# Patient Record
Sex: Female | Born: 1975 | Race: White | Hispanic: No | Smoking: Never smoker
Health system: Southern US, Community
[De-identification: ages and names within clinical notes are randomized; demographics above are authoritative.]

## PROBLEM LIST (undated history)

## (undated) DIAGNOSIS — E119 Type 2 diabetes mellitus without complications: Secondary | ICD-10-CM

## (undated) HISTORY — PX: APPENDECTOMY: SHX54

---

## 1997-03-01 ENCOUNTER — Observation Stay (HOSPITAL_COMMUNITY): Admission: AD | Admit: 1997-03-01 | Discharge: 1997-03-02 | Payer: Self-pay | Admitting: Obstetrics

## 1997-04-19 ENCOUNTER — Inpatient Hospital Stay (HOSPITAL_COMMUNITY): Admission: AD | Admit: 1997-04-19 | Discharge: 1997-04-23 | Payer: Self-pay | Admitting: *Deleted

## 1997-06-12 ENCOUNTER — Inpatient Hospital Stay (HOSPITAL_COMMUNITY): Admission: AD | Admit: 1997-06-12 | Discharge: 1997-06-12 | Payer: Self-pay | Admitting: Obstetrics & Gynecology

## 1997-06-14 ENCOUNTER — Inpatient Hospital Stay (HOSPITAL_COMMUNITY): Admission: AD | Admit: 1997-06-14 | Discharge: 1997-06-14 | Payer: Self-pay | Admitting: Obstetrics

## 1997-06-17 ENCOUNTER — Inpatient Hospital Stay (HOSPITAL_COMMUNITY): Admission: AD | Admit: 1997-06-17 | Discharge: 1997-06-19 | Payer: Self-pay | Admitting: Obstetrics

## 1998-05-31 ENCOUNTER — Emergency Department (HOSPITAL_COMMUNITY): Admission: EM | Admit: 1998-05-31 | Discharge: 1998-05-31 | Payer: Self-pay | Admitting: Internal Medicine

## 1999-03-01 ENCOUNTER — Emergency Department (HOSPITAL_COMMUNITY): Admission: EM | Admit: 1999-03-01 | Discharge: 1999-03-01 | Payer: Self-pay | Admitting: Emergency Medicine

## 1999-03-01 ENCOUNTER — Encounter: Payer: Self-pay | Admitting: Emergency Medicine

## 1999-11-22 ENCOUNTER — Other Ambulatory Visit: Admission: RE | Admit: 1999-11-22 | Discharge: 1999-11-22 | Payer: Self-pay | Admitting: Obstetrics and Gynecology

## 2000-10-08 ENCOUNTER — Other Ambulatory Visit: Admission: RE | Admit: 2000-10-08 | Discharge: 2000-10-08 | Payer: Self-pay | Admitting: Obstetrics and Gynecology

## 2000-10-23 ENCOUNTER — Encounter: Payer: Self-pay | Admitting: Emergency Medicine

## 2000-10-23 ENCOUNTER — Emergency Department (HOSPITAL_COMMUNITY): Admission: EM | Admit: 2000-10-23 | Discharge: 2000-10-24 | Payer: Self-pay | Admitting: Emergency Medicine

## 2000-12-02 ENCOUNTER — Emergency Department (HOSPITAL_COMMUNITY): Admission: EM | Admit: 2000-12-02 | Discharge: 2000-12-02 | Payer: Self-pay | Admitting: Emergency Medicine

## 2000-12-02 ENCOUNTER — Encounter: Payer: Self-pay | Admitting: Emergency Medicine

## 2001-06-05 ENCOUNTER — Inpatient Hospital Stay (HOSPITAL_COMMUNITY): Admission: AD | Admit: 2001-06-05 | Discharge: 2001-06-05 | Payer: Self-pay | Admitting: *Deleted

## 2001-06-06 ENCOUNTER — Emergency Department (HOSPITAL_COMMUNITY): Admission: EM | Admit: 2001-06-06 | Discharge: 2001-06-06 | Payer: Self-pay | Admitting: Emergency Medicine

## 2001-12-30 ENCOUNTER — Inpatient Hospital Stay (HOSPITAL_COMMUNITY): Admission: AD | Admit: 2001-12-30 | Discharge: 2001-12-30 | Payer: Self-pay | Admitting: *Deleted

## 2002-02-24 ENCOUNTER — Inpatient Hospital Stay (HOSPITAL_COMMUNITY): Admission: AD | Admit: 2002-02-24 | Discharge: 2002-02-24 | Payer: Self-pay | Admitting: *Deleted

## 2002-03-09 ENCOUNTER — Inpatient Hospital Stay (HOSPITAL_COMMUNITY): Admission: AD | Admit: 2002-03-09 | Discharge: 2002-03-11 | Payer: Self-pay | Admitting: *Deleted

## 2002-04-20 ENCOUNTER — Other Ambulatory Visit: Admission: RE | Admit: 2002-04-20 | Discharge: 2002-04-20 | Payer: Self-pay | Admitting: *Deleted

## 2003-08-22 ENCOUNTER — Emergency Department (HOSPITAL_COMMUNITY): Admission: EM | Admit: 2003-08-22 | Discharge: 2003-08-22 | Payer: Self-pay | Admitting: Emergency Medicine

## 2004-01-05 ENCOUNTER — Emergency Department (HOSPITAL_COMMUNITY): Admission: EM | Admit: 2004-01-05 | Discharge: 2004-01-05 | Payer: Self-pay | Admitting: Emergency Medicine

## 2004-01-05 ENCOUNTER — Encounter: Payer: Self-pay | Admitting: Obstetrics and Gynecology

## 2004-01-09 ENCOUNTER — Inpatient Hospital Stay (HOSPITAL_COMMUNITY): Admission: AD | Admit: 2004-01-09 | Discharge: 2004-01-09 | Payer: Self-pay | Admitting: Obstetrics and Gynecology

## 2004-01-14 ENCOUNTER — Inpatient Hospital Stay (HOSPITAL_COMMUNITY): Admission: AD | Admit: 2004-01-14 | Discharge: 2004-01-14 | Payer: Self-pay | Admitting: Obstetrics & Gynecology

## 2004-01-24 ENCOUNTER — Inpatient Hospital Stay (HOSPITAL_COMMUNITY): Admission: AD | Admit: 2004-01-24 | Discharge: 2004-01-24 | Payer: Self-pay | Admitting: Family Medicine

## 2004-10-11 ENCOUNTER — Emergency Department (HOSPITAL_COMMUNITY): Admission: EM | Admit: 2004-10-11 | Discharge: 2004-10-11 | Payer: Self-pay | Admitting: Emergency Medicine

## 2005-02-26 ENCOUNTER — Emergency Department (HOSPITAL_COMMUNITY): Admission: EM | Admit: 2005-02-26 | Discharge: 2005-02-26 | Payer: Self-pay | Admitting: Emergency Medicine

## 2005-11-05 ENCOUNTER — Inpatient Hospital Stay (HOSPITAL_COMMUNITY): Admission: AD | Admit: 2005-11-05 | Discharge: 2005-11-05 | Payer: Self-pay | Admitting: Obstetrics and Gynecology

## 2006-10-24 ENCOUNTER — Emergency Department (HOSPITAL_COMMUNITY): Admission: EM | Admit: 2006-10-24 | Discharge: 2006-10-24 | Payer: Self-pay | Admitting: Emergency Medicine

## 2010-02-18 ENCOUNTER — Encounter: Payer: Self-pay | Admitting: Obstetrics and Gynecology

## 2019-02-03 ENCOUNTER — Other Ambulatory Visit: Payer: Self-pay

## 2019-02-03 ENCOUNTER — Emergency Department (INDEPENDENT_AMBULATORY_CARE_PROVIDER_SITE_OTHER)
Admission: EM | Admit: 2019-02-03 | Discharge: 2019-02-03 | Disposition: A | Discharge status: Home / Self Care | Payer: Self-pay | Source: Home / Self Care

## 2019-02-03 ENCOUNTER — Encounter: Payer: Self-pay | Admitting: Emergency Medicine

## 2019-02-03 DIAGNOSIS — Z20822 Contact with and (suspected) exposure to covid-19: Secondary | ICD-10-CM

## 2019-02-03 HISTORY — DX: Type 2 diabetes mellitus without complications: E11.9

## 2019-02-03 NOTE — Discharge Instructions (Addendum)
Quarantine until receiving the results of your COVID swab

## 2019-02-03 NOTE — ED Triage Notes (Signed)
Pt states someone at work is out being tested for covid and she has to have test to return. No symptoms.

## 2019-02-03 NOTE — ED Provider Notes (Signed)
[facility]    CSN: [id number] Arrival date & time: 02/03/19  1215      History   Chief Complaint Chief Complaint  Patient presents with  . covid exposure    HPI Heidi Swanson is a 44 y.o. female.   44 year old female, with history of diabetes, presenting today for Covid testing.  Patient states that on Sunday, she was around a coworker that woke up Monday with Covid symptoms.  Patient states that she is unable to return to work until she receives a Covid swab.  Patient asymptomatic.  The history is provided by the patient.  URI Presenting symptoms: no congestion, no cough, no ear pain, no fever and no sore throat   Severity:  Mild Timing:  Constant Progression:  Unchanged Chronicity:  New Relieved by:  Nothing Worsened by:  Nothing Ineffective treatments:  None tried Associated symptoms: no arthralgias and no headaches   Risk factors: sick contacts   Risk factors: not elderly, no chronic cardiac disease, no chronic kidney disease, no chronic respiratory disease, no diabetes mellitus, no immunosuppression, no recent illness and no recent travel     Past Medical History:  Diagnosis Date  . Diabetes mellitus without complication ([place])     There are no problems to display for this patient.   History reviewed. No pertinent surgical history.  OB History   No obstetric history on file.      Home Medications    Prior to Admission medications   Medication Sig Start Date End Date Taking? Authorizing Provider  aspirin 81 MG EC tablet Take by mouth. 07/06/18 07/06/19 Yes [provider]  metFORMIN (GLUCOPHAGE) 500 MG tablet Take by mouth.    [provider]    Family History History reviewed. No pertinent family history.  Social History Social History   Tobacco Use  . Smoking status: Never Smoker  . Smokeless tobacco: Never Used  Substance Use Topics  . Alcohol use: Not on file  . Drug use: Not on file     Allergies     Patient has no allergy information on record.   Review of Systems Review of Systems  Constitutional: Negative for chills and fever.  HENT: Negative for congestion, ear pain and sore throat.   Eyes: Negative for pain and visual disturbance.  Respiratory: Negative for cough and shortness of breath.   Cardiovascular: Negative for chest pain and palpitations.  Gastrointestinal: Negative for abdominal pain and vomiting.  Genitourinary: Negative for dysuria and hematuria.  Musculoskeletal: Negative for arthralgias and back pain.  Skin: Negative for color change and rash.  Neurological: Negative for seizures, syncope and headaches.  All other systems reviewed and are negative.    Physical Exam Triage Vital Signs ED Triage Vitals  Enc Vitals Group     BP 02/03/19 1247 113/80     Pulse Rate 02/03/19 1247 96     Resp --      Temp 02/03/19 1247 98.4 F (36.9 C)     Temp Source 02/03/19 1247 Oral     SpO2 02/03/19 1247 98 %     Weight 02/03/19 1248 152 lb (68.9 kg)     Height --      Head Circumference --      Peak Flow --      Pain Score 02/03/19 1248 0     Pain Loc --      Pain Edu? --      Excl. in GC? --    No  data found.  Updated Vital Signs BP 113/80 (BP Location: Right Arm)   Pulse 96   Temp 98.4 F (36.9 C) (Oral)   Wt 152 lb (68.9 kg)   LMP 01/26/2019   SpO2 98%   Visual Acuity Right Eye Distance:   Left Eye Distance:   Bilateral Distance:    Right Eye Near:   Left Eye Near:    Bilateral Near:     Physical Exam Vitals and nursing note reviewed.  Constitutional:      General: She is not in acute distress.    Appearance: She is well-developed.  HENT:     Head: Normocephalic and atraumatic.  Eyes:     Conjunctiva/sclera: Conjunctivae normal.  Cardiovascular:     Rate and Rhythm: Normal rate and regular rhythm.     Heart sounds: No murmur.  Pulmonary:     Effort: Pulmonary effort is normal. No respiratory distress.     Breath sounds: Normal breath  sounds.  Abdominal:     Palpations: Abdomen is soft.     Tenderness: There is no abdominal tenderness.  Musculoskeletal:     Cervical back: Neck supple.  Skin:    General: Skin is warm and dry.  Neurological:     Mental Status: She is alert.      UC Treatments / Results  Labs (all labs ordered are listed, but only abnormal results are displayed) Labs Reviewed  NOVEL CORONAVIRUS, NAA    EKG   Radiology No results found.  Procedures Procedures (including critical care time)  Medications Ordered in UC Medications - No data to display  Initial Impression / Assessment and Plan / UC Course  I have reviewed the triage vital signs and the nursing notes.  Pertinent labs & imaging results that were available during my care of the patient were reviewed by me and considered in my medical decision making (see chart for details).     Patient with possible exposure to Covid contact.  Patient asymptomatic.  Here today for testing to return to work.  She will quarantine until receiving results. Final Clinical Impressions(s) / UC Diagnoses   Final diagnoses:  Exposure to COVID-19 virus     Discharge Instructions     Quarantine until receiving the results of your COVID swab    ED Prescriptions    None     PDMP not reviewed this encounter.   Alecia Lemming, [place] 02/03/19 1258

## 2019-02-05 LAB — NOVEL CORONAVIRUS, NAA: SARS-CoV-2, NAA: DETECTED — AB

## 2019-02-06 ENCOUNTER — Telehealth: Payer: Self-pay | Admitting: Emergency Medicine

## 2019-02-06 NOTE — Telephone Encounter (Signed)

## 2019-02-06 NOTE — Telephone Encounter (Signed)
Patient contacted by phone and made aware of  positive covid  results. Pt verbalized understanding and had all questions answered.    

## 2019-09-04 ENCOUNTER — Emergency Department (HOSPITAL_BASED_OUTPATIENT_CLINIC_OR_DEPARTMENT_OTHER)
Admission: EM | Admit: 2019-09-04 | Discharge: 2019-09-04 | Disposition: A | Discharge status: Home / Self Care | Payer: BC Managed Care – PPO | Attending: Emergency Medicine | Admitting: Emergency Medicine

## 2019-09-04 ENCOUNTER — Other Ambulatory Visit: Payer: Self-pay

## 2019-09-04 ENCOUNTER — Emergency Department (HOSPITAL_BASED_OUTPATIENT_CLINIC_OR_DEPARTMENT_OTHER): Payer: BC Managed Care – PPO

## 2019-09-04 ENCOUNTER — Encounter (HOSPITAL_BASED_OUTPATIENT_CLINIC_OR_DEPARTMENT_OTHER): Payer: Self-pay

## 2019-09-04 DIAGNOSIS — M25562 Pain in left knee: Secondary | ICD-10-CM | POA: Diagnosis present

## 2019-09-04 DIAGNOSIS — R0789 Other chest pain: Secondary | ICD-10-CM | POA: Diagnosis not present

## 2019-09-04 DIAGNOSIS — Z5321 Procedure and treatment not carried out due to patient leaving prior to being seen by health care provider: Secondary | ICD-10-CM | POA: Insufficient documentation

## 2019-09-04 LAB — CBC
HCT: 32.6 % — ABNORMAL LOW (ref 36.0–46.0)
Hemoglobin: 9.5 g/dL — ABNORMAL LOW (ref 12.0–15.0)
MCH: 21.1 pg — ABNORMAL LOW (ref 26.0–34.0)
MCHC: 29.1 g/dL — ABNORMAL LOW (ref 30.0–36.0)
MCV: 72.3 fL — ABNORMAL LOW (ref 80.0–100.0)
Platelets: 446 10*3/uL — ABNORMAL HIGH (ref 150–400)
RBC: 4.51 MIL/uL (ref 3.87–5.11)
RDW: 18.9 % — ABNORMAL HIGH (ref 11.5–15.5)
WBC: 8.8 10*3/uL (ref 4.0–10.5)
nRBC: 0 % (ref 0.0–0.2)

## 2019-09-04 LAB — TROPONIN I (HIGH SENSITIVITY): Troponin I (High Sensitivity): 4 ng/L (ref <18)

## 2019-09-04 LAB — BASIC METABOLIC PANEL
Anion gap: 12 (ref 5–15)
BUN: 10 mg/dL (ref 6–20)
CO2: 21 mmol/L — ABNORMAL LOW (ref 22–32)
Calcium: 9 mg/dL (ref 8.9–10.3)
Chloride: 104 mmol/L (ref 98–111)
Creatinine, Ser: 0.73 mg/dL (ref 0.44–1.00)
GFR calc Af Amer: >60 mL/min (ref >60)
GFR calc non Af Amer: >60 mL/min (ref >60)
Glucose, Bld: 209 mg/dL — ABNORMAL HIGH (ref 70–99)
Potassium: 3.6 mmol/L (ref 3.5–5.1)
Sodium: 137 mmol/L (ref 135–145)

## 2019-09-04 LAB — D-DIMER, QUANTITATIVE: D-Dimer, Quant: 0.74 ug/mL-FEU — ABNORMAL HIGH (ref 0.00–0.50)

## 2019-09-04 LAB — PREGNANCY, URINE: Preg Test, Ur: NEGATIVE

## 2019-09-04 NOTE — ED Triage Notes (Signed)
Back of left knee pain without known injury, chest pain started yesterday, central constant chest pressure, BP has been higher than normal @ home lately.

## 2019-09-04 NOTE — ED Notes (Signed)
Pt reported to registration clerk she had an emergency at home and had to leave

## 2019-09-05 ENCOUNTER — Emergency Department (HOSPITAL_COMMUNITY): Payer: BC Managed Care – PPO

## 2019-09-05 ENCOUNTER — Other Ambulatory Visit: Payer: Self-pay

## 2019-09-05 ENCOUNTER — Emergency Department (HOSPITAL_BASED_OUTPATIENT_CLINIC_OR_DEPARTMENT_OTHER): Payer: BC Managed Care – PPO

## 2019-09-05 ENCOUNTER — Emergency Department (HOSPITAL_COMMUNITY)
Admission: EM | Admit: 2019-09-05 | Discharge: 2019-09-05 | Disposition: A | Discharge status: Home / Self Care | Payer: BC Managed Care – PPO | Attending: Emergency Medicine | Admitting: Emergency Medicine

## 2019-09-05 ENCOUNTER — Encounter (HOSPITAL_COMMUNITY): Payer: Self-pay

## 2019-09-05 DIAGNOSIS — R079 Chest pain, unspecified: Secondary | ICD-10-CM | POA: Diagnosis present

## 2019-09-05 DIAGNOSIS — M791 Myalgia, unspecified site: Secondary | ICD-10-CM | POA: Diagnosis not present

## 2019-09-05 DIAGNOSIS — M79609 Pain in unspecified limb: Secondary | ICD-10-CM | POA: Diagnosis not present

## 2019-09-05 DIAGNOSIS — M25562 Pain in left knee: Secondary | ICD-10-CM | POA: Insufficient documentation

## 2019-09-05 DIAGNOSIS — E119 Type 2 diabetes mellitus without complications: Secondary | ICD-10-CM | POA: Diagnosis not present

## 2019-09-05 DIAGNOSIS — R0789 Other chest pain: Secondary | ICD-10-CM

## 2019-09-05 LAB — BASIC METABOLIC PANEL
Anion gap: 11 (ref 5–15)
BUN: 9 mg/dL (ref 6–20)
CO2: 22 mmol/L (ref 22–32)
Calcium: 9.7 mg/dL (ref 8.9–10.3)
Chloride: 105 mmol/L (ref 98–111)
Creatinine, Ser: 0.59 mg/dL (ref 0.44–1.00)
GFR calc Af Amer: >60 mL/min (ref >60)
GFR calc non Af Amer: >60 mL/min (ref >60)
Glucose, Bld: 167 mg/dL — ABNORMAL HIGH (ref 70–99)
Potassium: 3.6 mmol/L (ref 3.5–5.1)
Sodium: 138 mmol/L (ref 135–145)

## 2019-09-05 LAB — CBG MONITORING, ED: Glucose-Capillary: 88 mg/dL (ref 70–99)

## 2019-09-05 LAB — CBC
HCT: 33.7 % — ABNORMAL LOW (ref 36.0–46.0)
Hemoglobin: 9.7 g/dL — ABNORMAL LOW (ref 12.0–15.0)
MCH: 21.1 pg — ABNORMAL LOW (ref 26.0–34.0)
MCHC: 28.8 g/dL — ABNORMAL LOW (ref 30.0–36.0)
MCV: 73.3 fL — ABNORMAL LOW (ref 80.0–100.0)
Platelets: 450 10*3/uL — ABNORMAL HIGH (ref 150–400)
RBC: 4.6 MIL/uL (ref 3.87–5.11)
RDW: 18.8 % — ABNORMAL HIGH (ref 11.5–15.5)
WBC: 8.5 10*3/uL (ref 4.0–10.5)
nRBC: 0 % (ref 0.0–0.2)

## 2019-09-05 LAB — D-DIMER, QUANTITATIVE: D-Dimer, Quant: 0.71 ug/mL-FEU — ABNORMAL HIGH (ref 0.00–0.50)

## 2019-09-05 LAB — TROPONIN I (HIGH SENSITIVITY)
Troponin I (High Sensitivity): 4 ng/L (ref <18)
Troponin I (High Sensitivity): 4 ng/L (ref <18)

## 2019-09-05 LAB — I-STAT BETA HCG BLOOD, ED (MC, WL, AP ONLY): I-stat hCG, quantitative: <5 m[IU]/mL (ref <5)

## 2019-09-05 MED ORDER — IOHEXOL 350 MG/ML SOLN
75.0000 mL | Freq: Once | INTRAVENOUS | Status: AC | PRN
  Administered 2019-09-05: 75 mL via INTRAVENOUS

## 2019-09-05 MED ORDER — SODIUM CHLORIDE 0.9% FLUSH: 3.0000 mL | Freq: Once | INTRAVENOUS | Status: DC

## 2019-09-05 NOTE — Progress Notes (Signed)
VASCULAR LAB    Bilateral lower extremity venous duplex completed.    Preliminary report:  See CV proc for preliminary results.  Roxanne Gates, RN, with report  Sherren Kerns, RVT 09/05/2019, 2:37 PM

## 2019-09-05 NOTE — ED Triage Notes (Addendum)
Pt reports Left posterior knee pain x1 day, then she developed constant chest pressure associated with SOB. Pt seen at [facility] yesterday, she was unable to stay d/t family concerns, pts D-dimer was elevated there.  Pt recently traveled to [place] Wednesday returning on Friday

## 2019-09-05 NOTE — ED Provider Notes (Signed)
[facility] EMERGENCY DEPARTMENT Provider Note   CSN: [id number] Arrival date & time: 09/05/19  1210     History Chief Complaint  Patient presents with  . Knee Pain  . Chest Pain    Heidi Swanson is a 44 y.o. female.  HPI  Patient presents to the emergency department for complaint of chest pain and knee pain.  Patient reportedly was traveling on Wednesday to a beach in the [place] when she arrived she had sudden onset left posterior knee pain.  This was not associated with any injury.  Patient noticed no warmth to touch or unilateral leg swelling at that time.  The pain subsequently went away however she did not developed chest pain.  Described as a pressure-like sensation and band around the chest that was squeezing her.  This is been intermittent since then.  She presented to an OSH ED yesterday for evaluation, there she had to leave for an emergency but was found to have elevated D-dimer.  She was asymptomatic when she left the department however pain returned overnight and that she came to the ED today for evaluation.  She denies any sick contacts.  No nausea, vomiting, or diarrhea.  No previous cardiac history although patient does admit to having intermittent chest pain in the past.  History of diabetes.  No previous history of DVT.  No fevers or chills.  No treatments attempted prior to arrival.      Past Medical History:  Diagnosis Date  . Diabetes mellitus without complication ([facility])     There are no problems to display for this patient.   Past Surgical History:  Procedure Laterality Date  . APPENDECTOMY       OB History   No obstetric history on file.     History reviewed. No pertinent family history.  Social History   Tobacco Use  . Smoking status: Never Smoker  . Smokeless tobacco: Never Used  Vaping Use  . Vaping Use: Never used  Substance Use Topics  . Alcohol use: Yes    Comment: rarely   . Drug use: Not Currently    Home  Medications Prior to Admission medications   Medication Sig Start Date End Date Taking? Authorizing Provider  metFORMIN (GLUCOPHAGE) 500 MG tablet Take by mouth.    [provider]    Allergies    Patient has no known allergies.  Review of Systems   Review of Systems  Constitutional: Negative for chills and fever.  HENT: Negative for ear pain and sore throat.   Eyes: Negative for pain and visual disturbance.  Respiratory: Negative for cough and shortness of breath.   Cardiovascular: Positive for chest pain. Negative for palpitations.  Gastrointestinal: Negative for abdominal pain and vomiting.  Genitourinary: Negative for dysuria and hematuria.  Musculoskeletal: Positive for myalgias. Negative for arthralgias and back pain.  Skin: Negative for color change and rash.  Neurological: Negative for seizures and syncope.  All other systems reviewed and are negative.   Physical Exam Updated Vital Signs BP (!) 132/93 (BP Location: Left Arm)   Pulse 86   Temp 98.9 F (37.2 C) (Oral)   Resp 20   Ht 5' (1.524 m)   Wt 70.3 kg   LMP 08/14/2019   SpO2 99%   BMI 30.27 kg/m   Physical Exam Vitals and nursing note reviewed.  Constitutional:      General: She is not in acute distress.    Appearance: She is well-developed and normal weight.  She is not ill-appearing or toxic-appearing.  HENT:     Head: Normocephalic and atraumatic.  Eyes:     Extraocular Movements: Extraocular movements intact.     Conjunctiva/sclera: Conjunctivae normal.     Pupils: Pupils are equal, round, and reactive to light.  Neck:     Vascular: No JVD.  Cardiovascular:     Rate and Rhythm: Normal rate and regular rhythm.     Pulses:          Radial pulses are 2+ on the right side and 2+ on the left side.       Dorsalis pedis pulses are 2+ on the right side and 2+ on the left side.     Heart sounds: No murmur heard.   Pulmonary:     Effort: Pulmonary effort is normal. No respiratory distress.      Breath sounds: Normal breath sounds. No wheezing or rhonchi.  Abdominal:     Palpations: Abdomen is soft.     Tenderness: There is no abdominal tenderness.  Musculoskeletal:     Cervical back: Neck supple.     Left lower leg: No tenderness. No edema.  Skin:    General: Skin is warm and dry.     Capillary Refill: Capillary refill takes less than 2 seconds.  Neurological:     General: No focal deficit present.     Mental Status: She is alert and oriented to person, place, and time.     Motor: No weakness.  Psychiatric:        Mood and Affect: Mood normal.        Behavior: Behavior normal.     ED Results / Procedures / Treatments   Labs (all labs ordered are listed, but only abnormal results are displayed) Labs Reviewed  BASIC METABOLIC PANEL - Abnormal; Notable for the following components:      Result Value   Glucose, Bld 167 (*)    All other components within normal limits  CBC - Abnormal; Notable for the following components:   Hemoglobin 9.7 (*)    HCT 33.7 (*)    MCV 73.3 (*)    MCH 21.1 (*)    MCHC 28.8 (*)    RDW 18.8 (*)    Platelets 450 (*)    All other components within normal limits  D-DIMER, QUANTITATIVE (NOT AT [facility]) - Abnormal; Notable for the following components:   D-Dimer, Quant 0.71 (*)    All other components within normal limits  I-STAT BETA HCG BLOOD, ED ([facility], [facility], [facility] ONLY)  TROPONIN I (HIGH SENSITIVITY)  TROPONIN I (HIGH SENSITIVITY)    EKG None  Radiology DG Chest 2 View  Result Date: 09/05/2019 CLINICAL DATA:  Chest pain and shortness of breath for 3 days. EXAM: CHEST - 2 VIEW COMPARISON:  09/04/2019 FINDINGS: The heart size and mediastinal contours are within normal limits. Both lungs are clear. The visualized skeletal structures are unremarkable. IMPRESSION: No active cardiopulmonary disease. Electronically Signed   By: Norva Pavlov M.D.   On: 09/05/2019 13:10   DG Chest 2 View  Result Date: 09/04/2019 CLINICAL DATA:  Acute onset chest  pain, shortness of breath, and dizziness. EXAM: CHEST - 2 VIEW COMPARISON:  None. FINDINGS: The heart size and mediastinal contours are within normal limits. Both lungs are clear. The visualized skeletal structures are unremarkable. IMPRESSION: No active cardiopulmonary disease. Electronically Signed   By: Burman Nieves M.D.   On: 09/04/2019 22:03   VAS [place] LOWER EXTREMITY VENOUS (DVT) (  ONLY [facility] & [facility])  Result Date: 09/05/2019  Lower Venous DVTStudy Indications: Left posterior knee pain.  Comparison Study: No prior study Performing Technologist: Sherren Kerns RVS  Examination Guidelines: A complete evaluation includes B-mode imaging, spectral Doppler, color Doppler, and power Doppler as needed of all accessible portions of each vessel. Bilateral testing is considered an integral part of a complete examination. Limited examinations for reoccurring indications may be performed as noted. The reflux portion of the exam is performed with the patient in reverse Trendelenburg.  +---------+---------------+---------+-----------+----------+--------------+ RIGHT    CompressibilityPhasicitySpontaneityPropertiesThrombus Aging +---------+---------------+---------+-----------+----------+--------------+ CFV      Full           Yes      Yes                                 +---------+---------------+---------+-----------+----------+--------------+ SFJ      Full                                                        +---------+---------------+---------+-----------+----------+--------------+ FV Prox  Full                                                        +---------+---------------+---------+-----------+----------+--------------+ FV Mid   Full                                                        +---------+---------------+---------+-----------+----------+--------------+ FV DistalFull                                                         +---------+---------------+---------+-----------+----------+--------------+ PFV      Full                                                        +---------+---------------+---------+-----------+----------+--------------+ POP      Full           Yes      Yes                                 +---------+---------------+---------+-----------+----------+--------------+ PTV      Full                                                        +---------+---------------+---------+-----------+----------+--------------+ PERO     Full                                                        +---------+---------------+---------+-----------+----------+--------------+   +---------+---------------+---------+-----------+----------+--------------+  LEFT     CompressibilityPhasicitySpontaneityPropertiesThrombus Aging +---------+---------------+---------+-----------+----------+--------------+ CFV      Full           Yes      Yes                                 +---------+---------------+---------+-----------+----------+--------------+ SFJ      Full                                                        +---------+---------------+---------+-----------+----------+--------------+ FV Prox  Full                                                        +---------+---------------+---------+-----------+----------+--------------+ FV Mid   Full                                                        +---------+---------------+---------+-----------+----------+--------------+ FV DistalFull                                                        +---------+---------------+---------+-----------+----------+--------------+ PFV      Full                                                        +---------+---------------+---------+-----------+----------+--------------+ POP      Full           Yes      Yes                                  +---------+---------------+---------+-----------+----------+--------------+ PTV      Full                                                        +---------+---------------+---------+-----------+----------+--------------+ PERO     Full                                                        +---------+---------------+---------+-----------+----------+--------------+     Summary: BILATERAL: - No evidence of deep vein thrombosis seen in the lower extremities, bilaterally. -No evidence of popliteal cyst, bilaterally.   *See table(s) above for measurements and observations.    Preliminary     Procedures Procedures (including critical care  time)  Medications Ordered in ED Medications  sodium chloride flush (NS) 0.9 % injection 3 mL (has no administration in time range)    ED Course   Orinda KennerDeborah A Bruun is a 44 y.o. female with PMHx listed that presents to the Emergency Department complaint of Knee Pain and Chest Pain  ED Course: Initial exam completed.   Well-appearing and hemodynamically stable.  Nontoxic and afebrile.  Physical exam significant for age-appropriate 44 year old female with extremities well perfused, no unilateral leg swelling, strong peripheral pulses, clear breath sounds, distended, nontender.  Initial differential includes acute coronary syndrome, DVT, pulmonary embolism, acid reflux, pneumonia, pneumothorax, electrolyte abnormalities, and musculoskeletal pain.   Triage labs and work-up reviewed.  EKG sinus rhythm with nonspecific ST changes. Pregnancy negative.  BMP with no acute electrolyte numbness requiring urgent intervention.  CBC without evidence of leukocytosis or leukopenia and stable hemoglobin although slightly anemic 9.7.  D-dimer elevated 0.71  (yesterday 0.74).  Initial troponin negative.  Ultrasound of the left lower extremity without evidence of DVT or popliteal cyst.  CXR no acute cardiopulmonary normality.  Delta troponin negative.  CT PE study without  evidence of pulmonary embolism.  Overall, low suspicion for acute coronary syndrome or other serious causes of the patient's chest pain at this time.   Diagnostics Vital Signs: reviewed Labs: reviewed and significant findings discussed above Imaging: personally reviewed images interpreted by radiology EKG: reviewed Records: nursing notes along with previous records reviewed and pertinent data discussed   Consults:  None   Reevaluation/Disposition:  Upon reevaluation, patients symptoms stable/improved. No active nausea/vomiting and ambulatory without assistance prior to discharge from the emergency department.    All questions answered.  Strict return precautions were discussed. Additionally we discussed establishing and/or following-up with primary care physician.  Patient and/or family was understanding and in agreement with today's assessment and plan.   Campbell RichesAlex Breona Cherubin, MD Emergency Medicine, PGY-3   Note: Dragon medical dictation software was used in the creation of this note.    Final Clinical Impression(s) / ED Diagnoses Final diagnoses:  None    Rx / DC Orders ED Discharge Orders    None       Nino ParsleyGross, Khalil Belote, MD 09/05/19 Barnie [id number]    Blane OharaZavitz, Joshua, MD 09/06/19 0005

## 2021-11-05 IMAGING — CR DG CHEST 2V
2 series · 2 of 2 positions shown · non-contrast
Comparison: None.

CLINICAL DATA: Acute onset chest pain, shortness of breath, and
dizziness.

EXAM:
CHEST - 2 VIEW

[w chest pa]
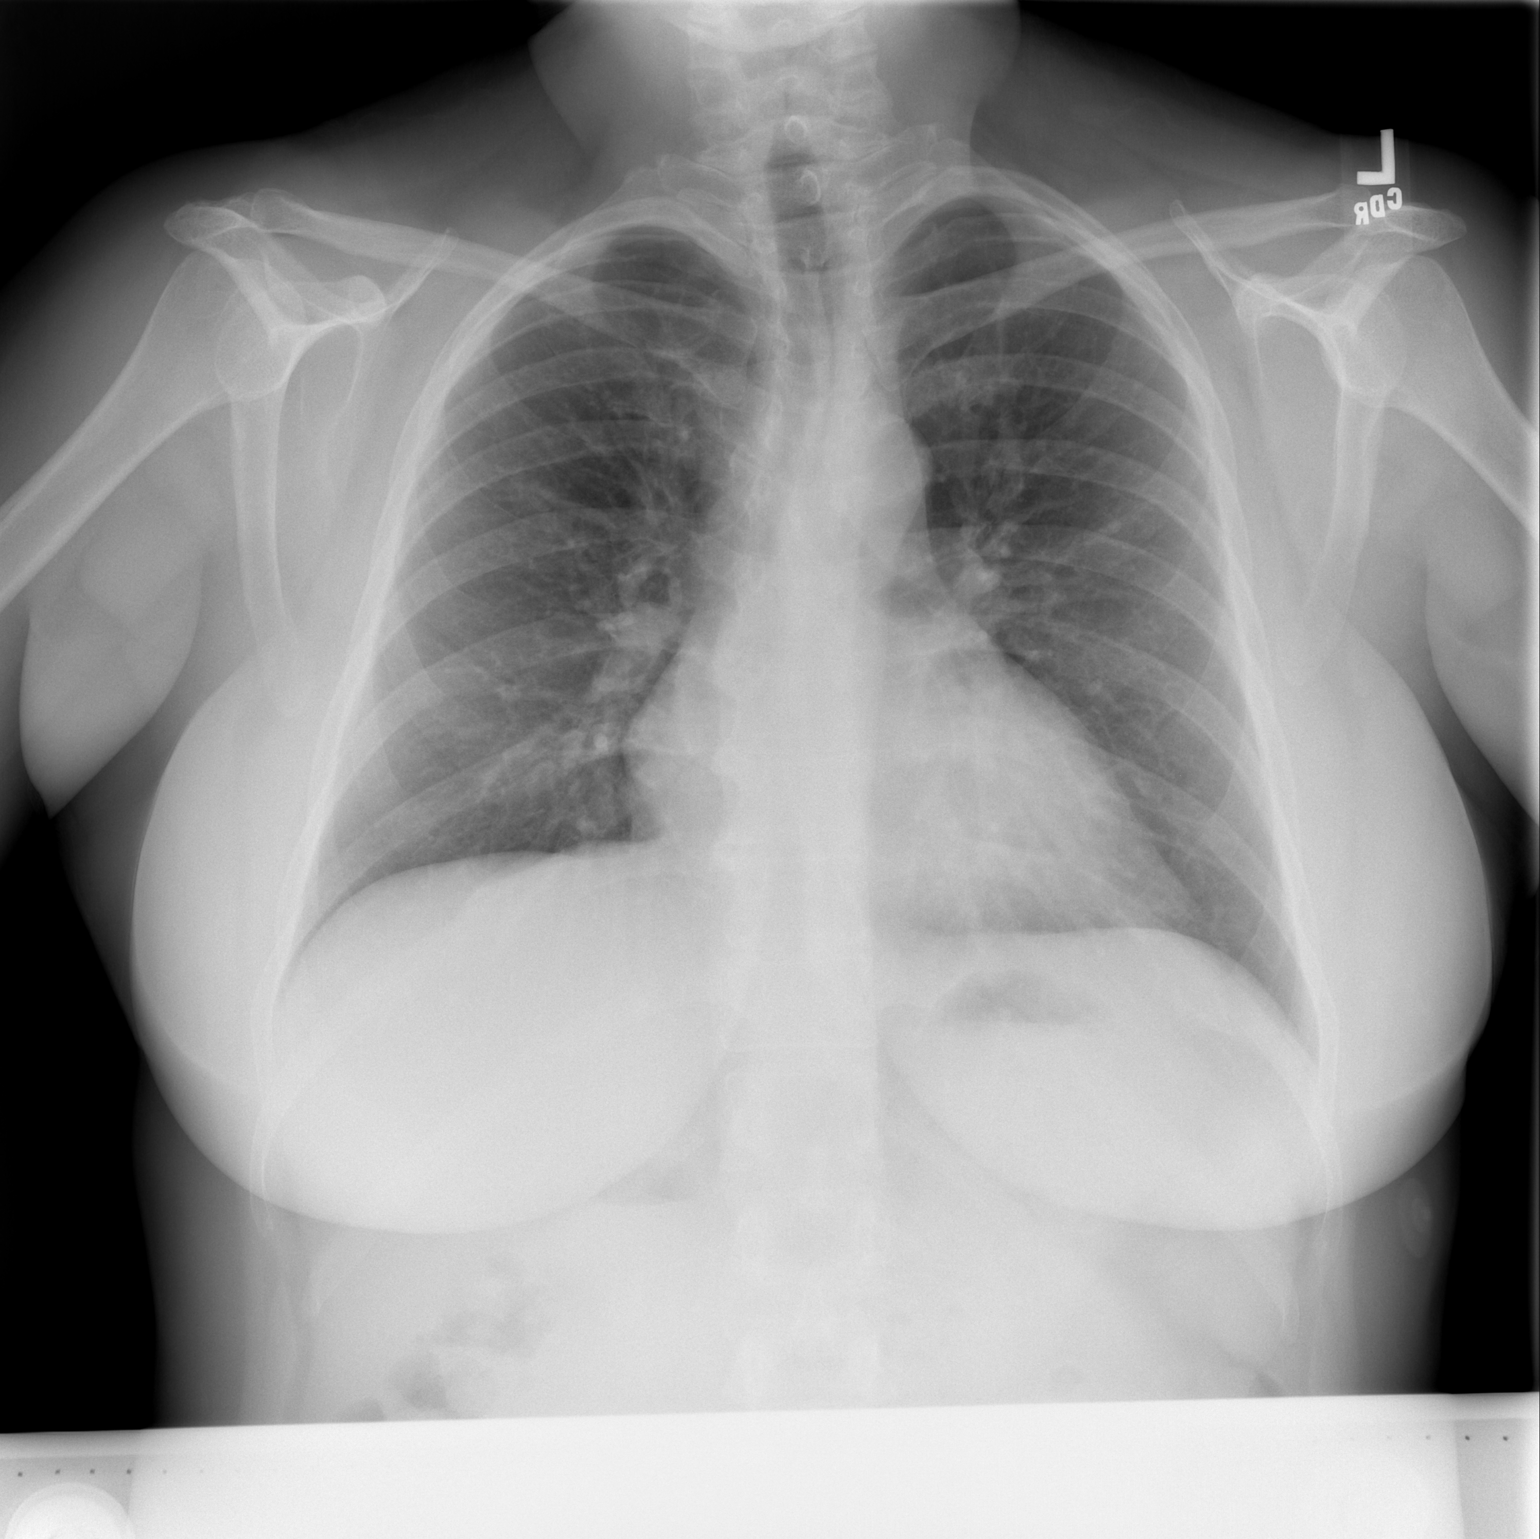

[w chest lat]
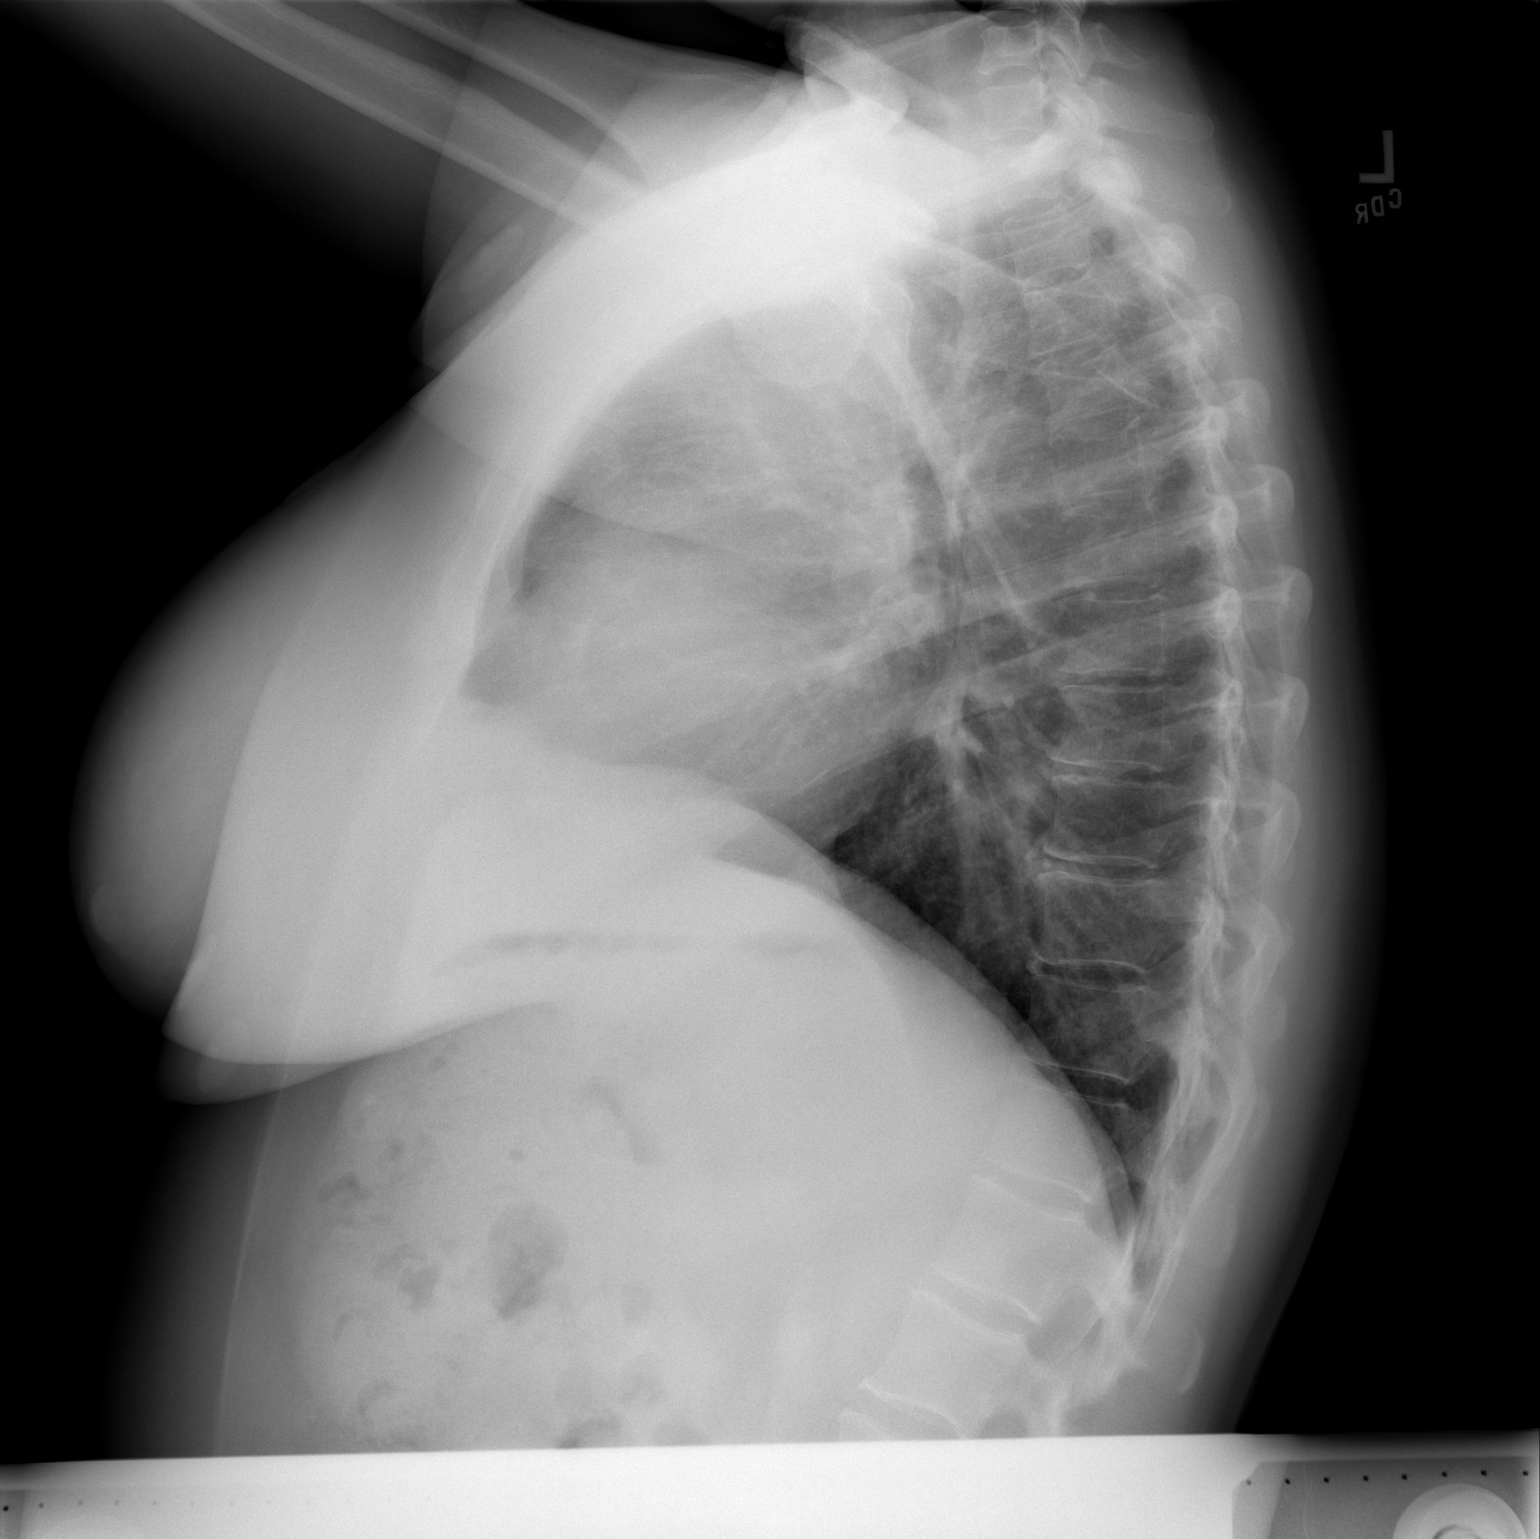

[2 of 2 positions shown; findings below may reference images not displayed]

FINDINGS: The heart size and mediastinal contours are within normal limits.
Both lungs are clear. The visualized skeletal structures are
unremarkable.
IMPRESSION: No active cardiopulmonary disease.

## 2021-11-06 IMAGING — DX DG CHEST 2V
2 series · 2 of 2 positions shown · non-contrast
Comparison: 09/04/2019

CLINICAL DATA: Chest pain and shortness of breath for 3 days.

EXAM:
CHEST - 2 VIEW

[chest pa]
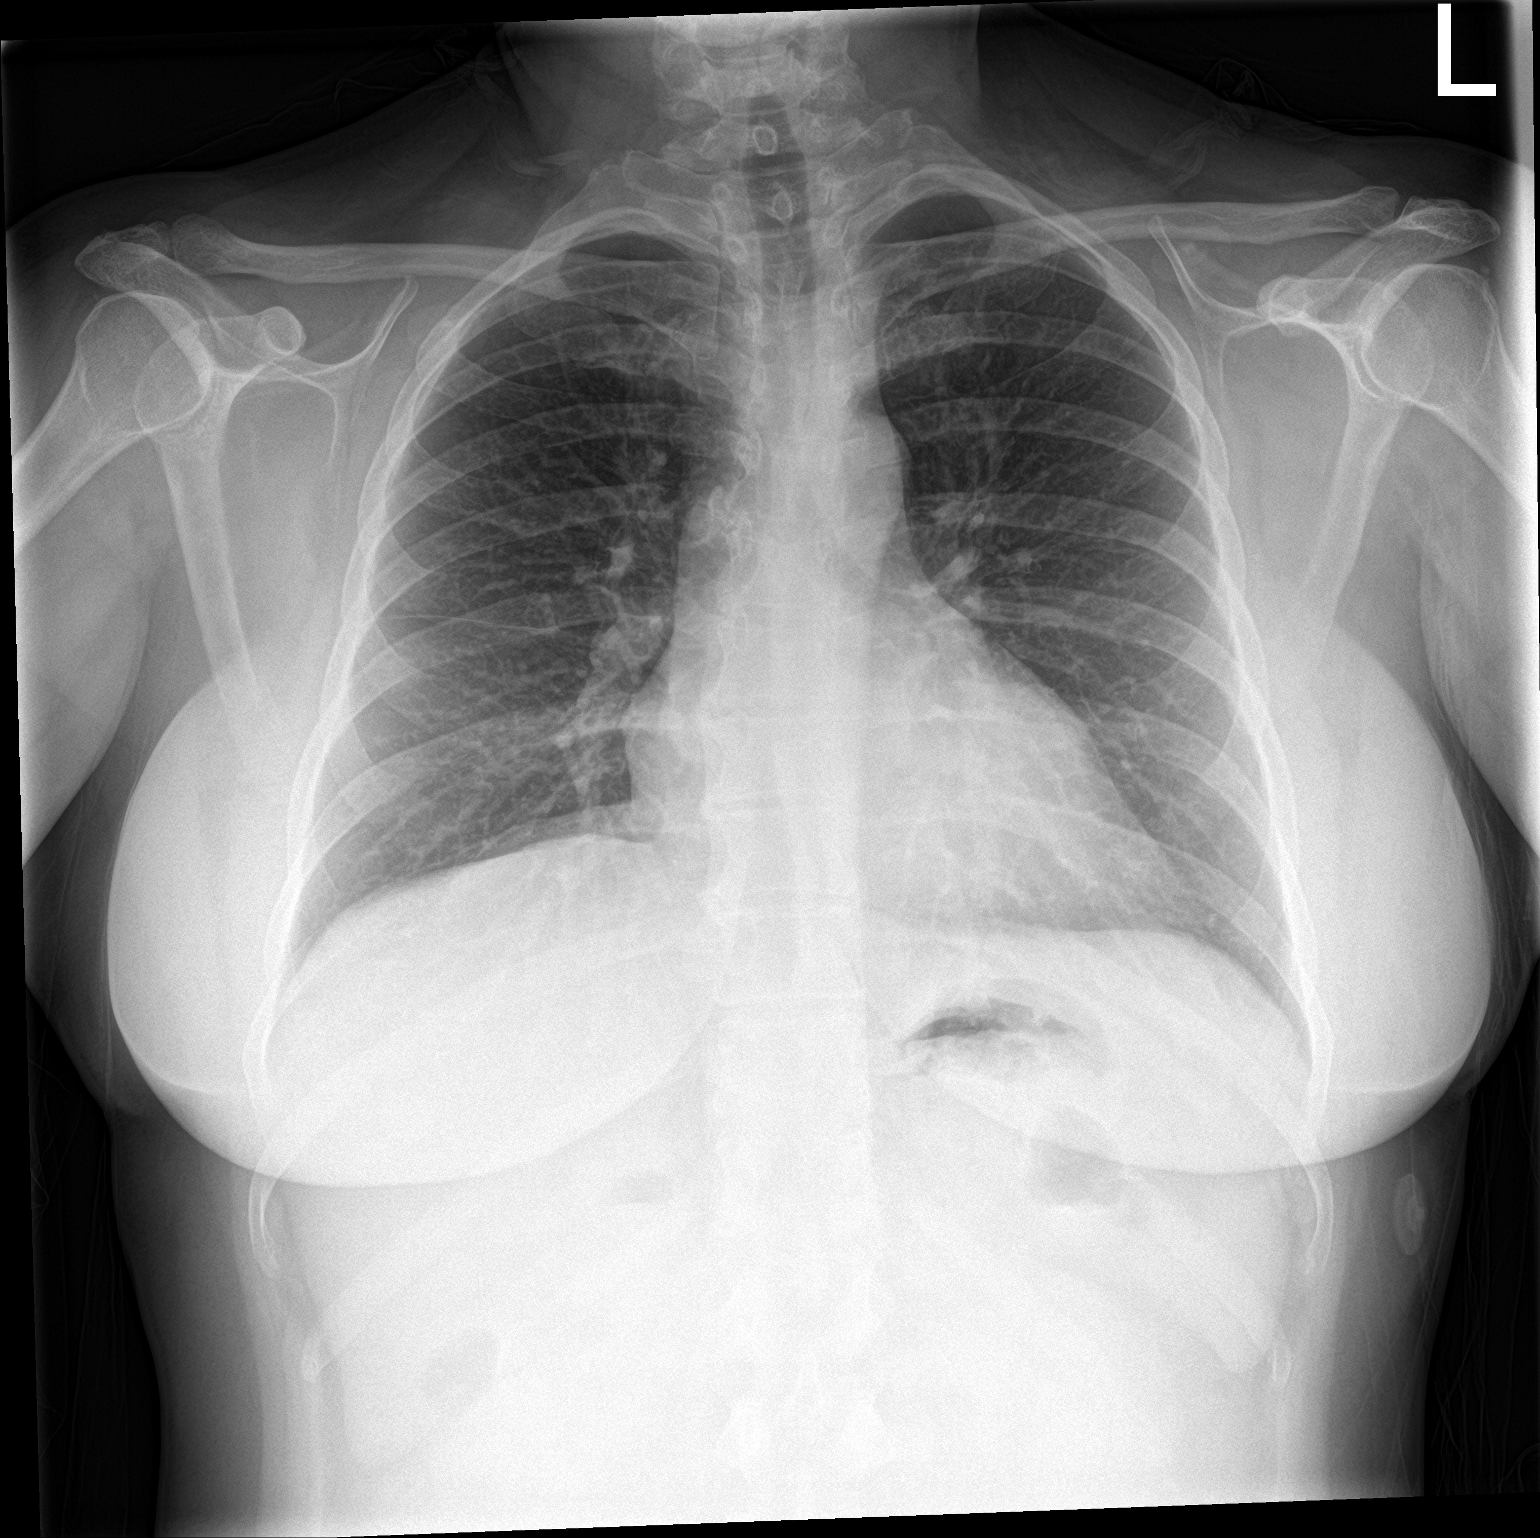

[chest lat]
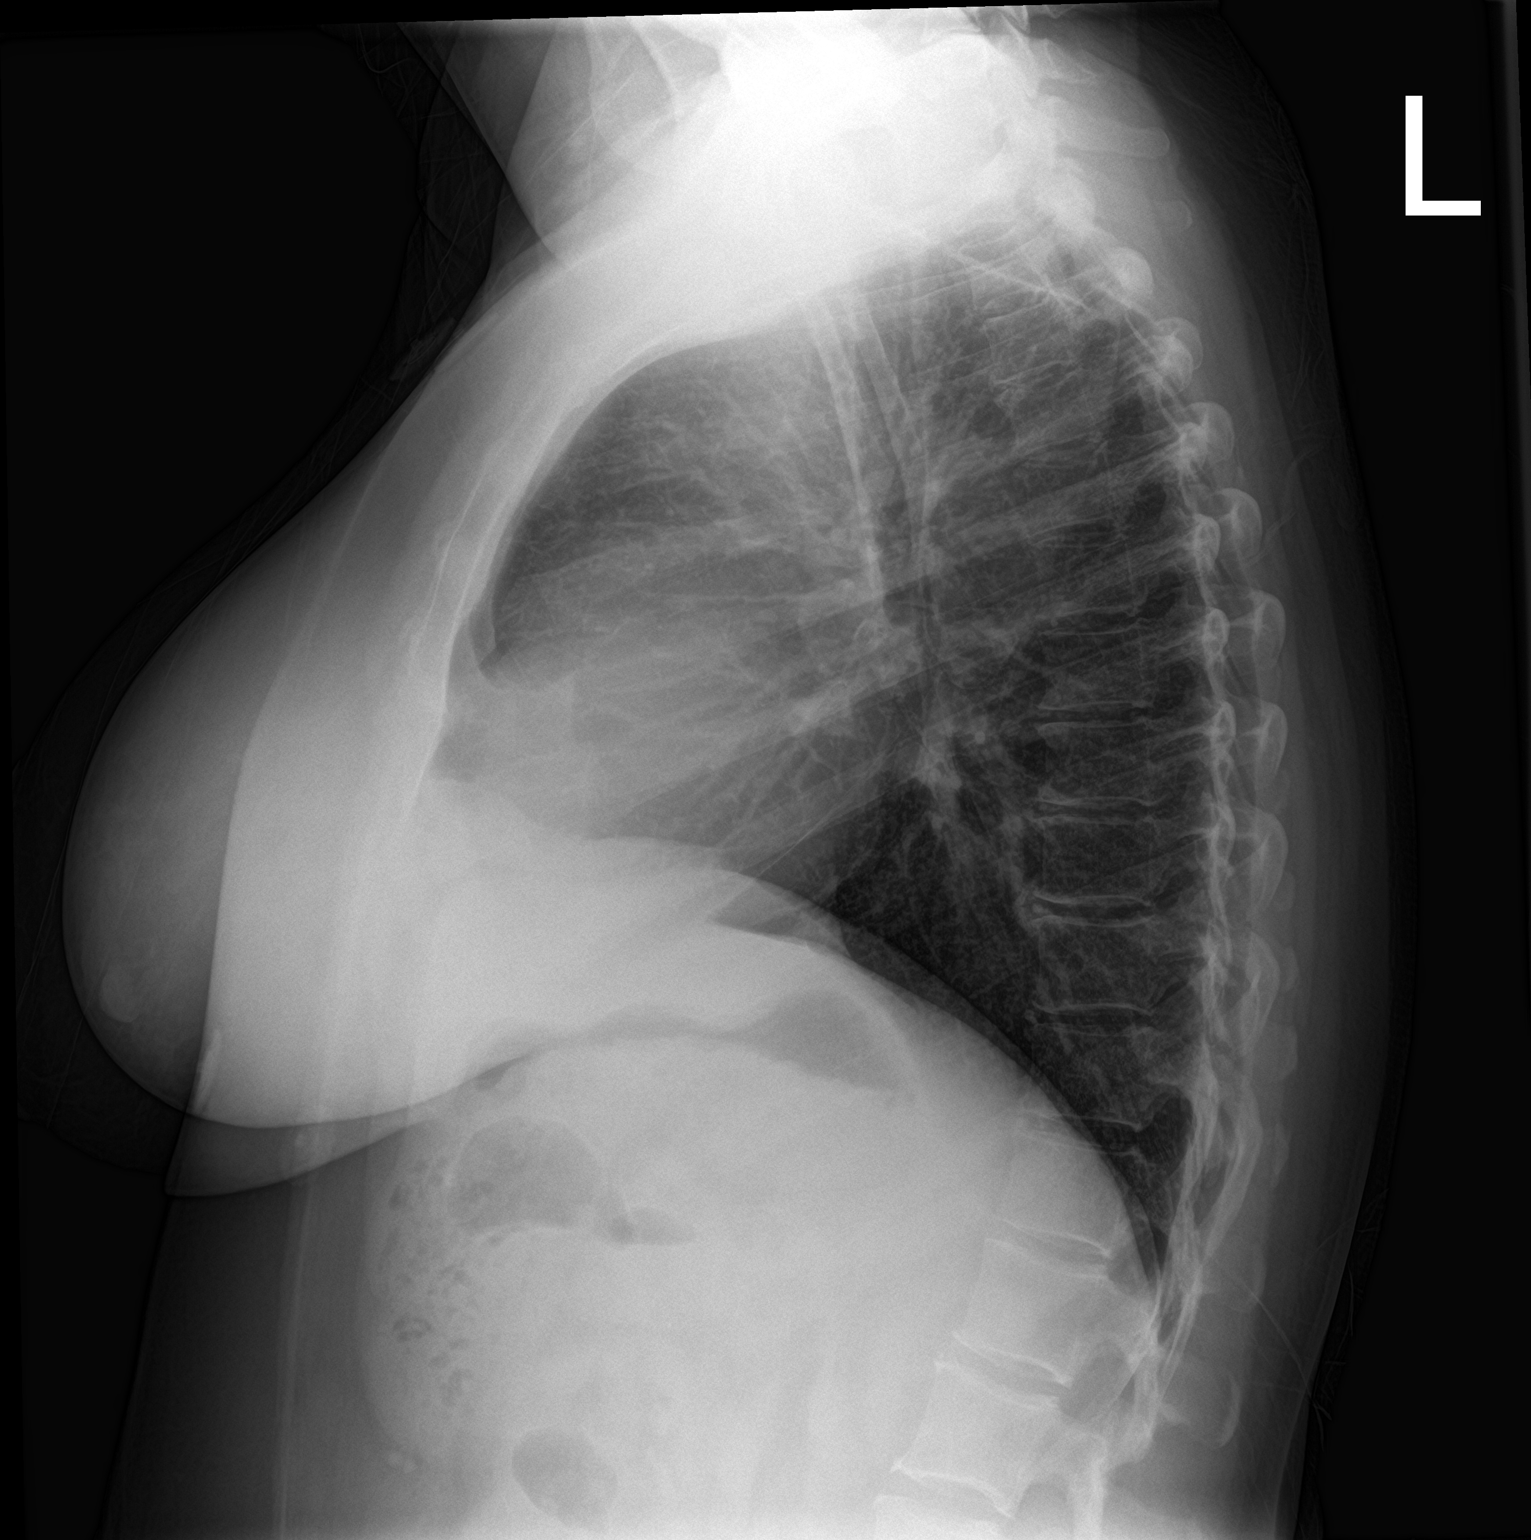

[2 of 2 positions shown; findings below may reference images not displayed]

FINDINGS: The heart size and mediastinal contours are within normal limits.
Both lungs are clear. The visualized skeletal structures are
unremarkable.
IMPRESSION: No active cardiopulmonary disease.

## 2021-11-06 IMAGING — CT CT ANGIO CHEST
2 of 7 series · 19 of 46 positions shown · IV contrast (omnipaque)
Comparison: None.

CLINICAL DATA: PE suspected, positive D-dimer

EXAM:
CT ANGIOGRAPHY CHEST WITH CONTRAST
TECHNIQUE: Multidetector CT imaging of the chest was performed using the
standard protocol during bolus administration of intravenous
contrast. Multiplanar CT image reconstructions and MIPs were
obtained to evaluate the vascular anatomy.
CONTRAST:  75mL OMNIPAQUE IOHEXOL 350 MG/ML SOLN

[Series 6: thins · axial · 0.72mm/px · z∈[+1228,+1452]mm · 16 of 254 slices shown]
[im 15/254  lung]
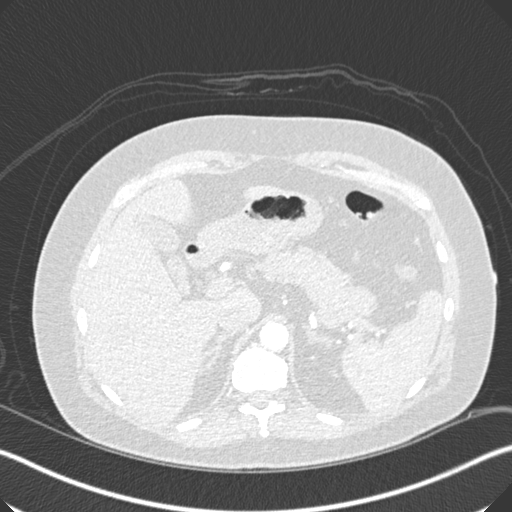
[im 29/254  soft-tissue]
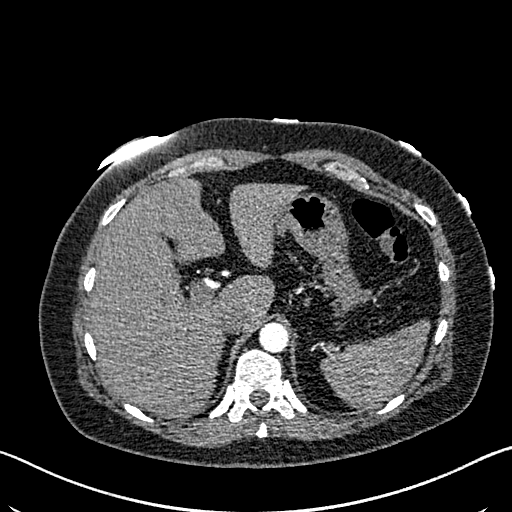
[im 43/254  lung]
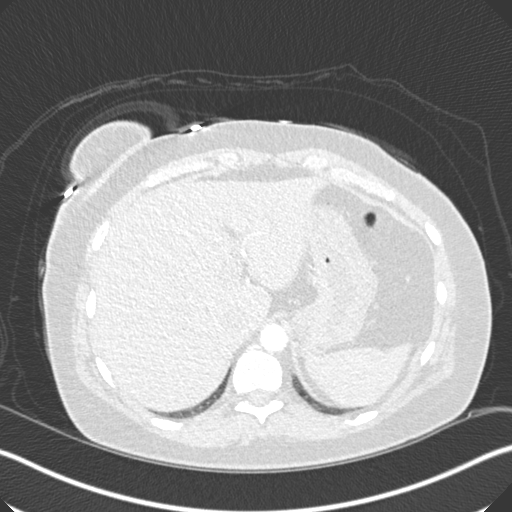
[im 57/254  soft-tissue]
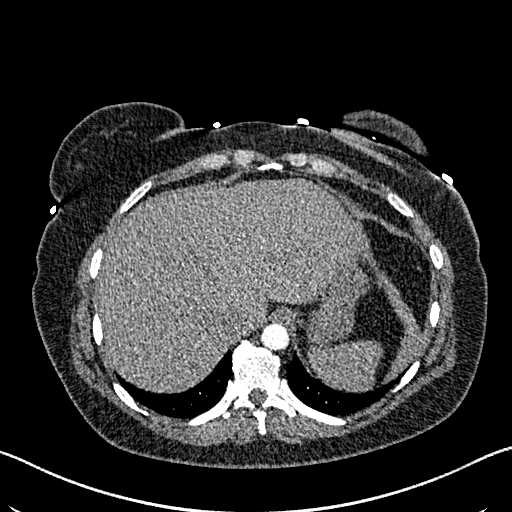
[im 71/254  lung]
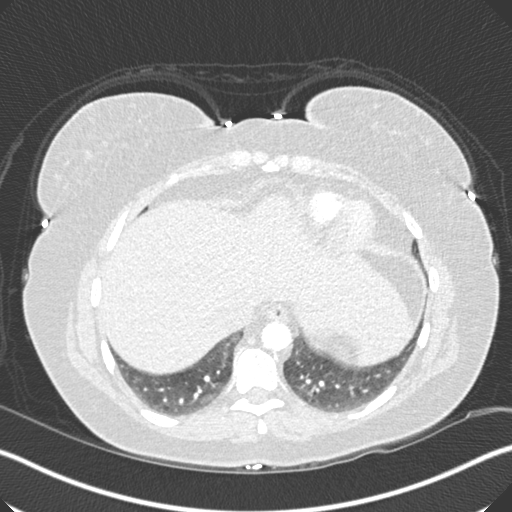
[im 85/254  soft-tissue]
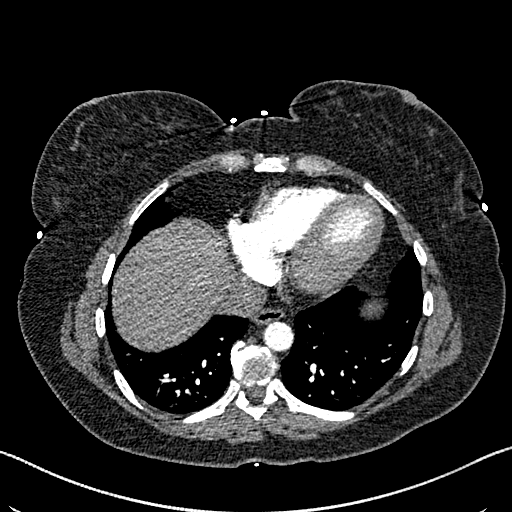
[im 99/254  lung]
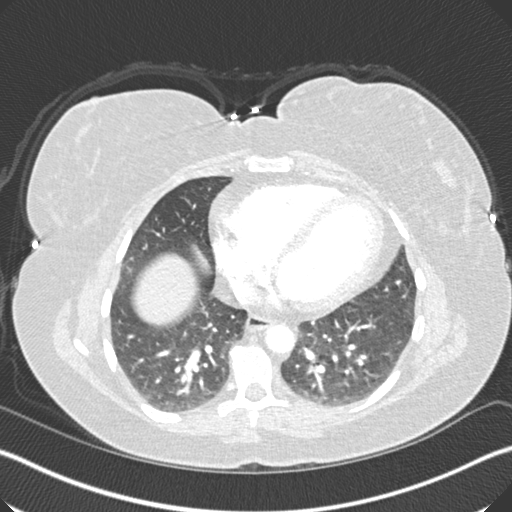
[im 113/254  soft-tissue]
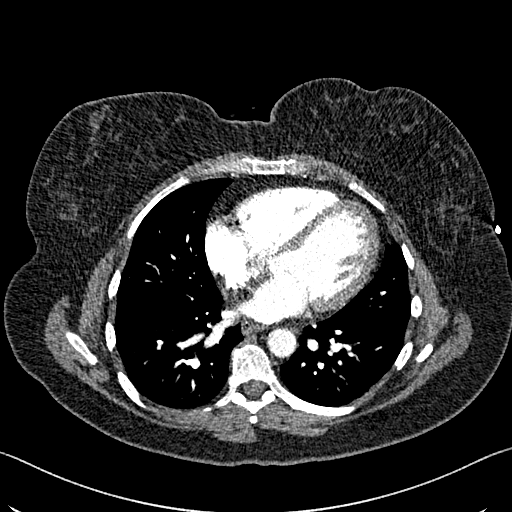
[im 141/254  lung]
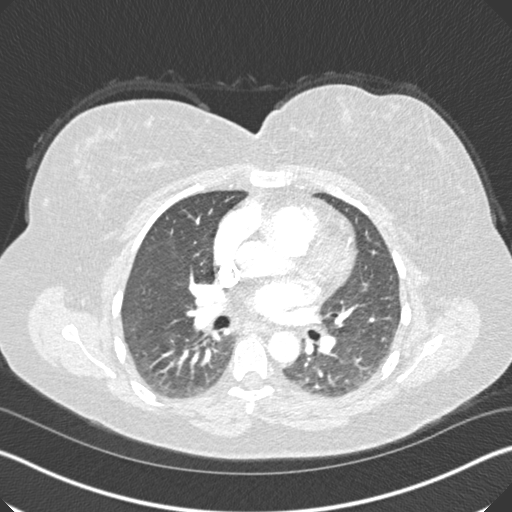
[im 155/254  soft-tissue]
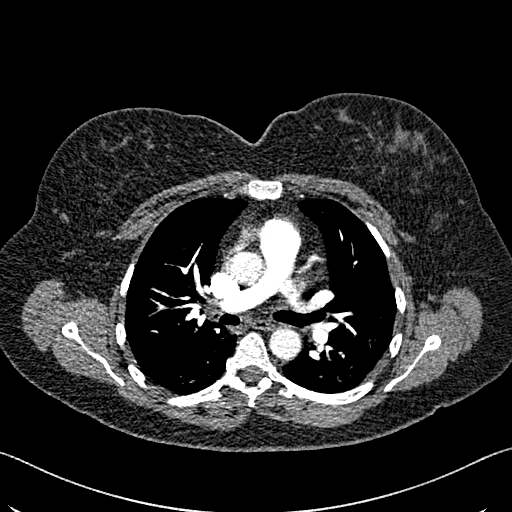
[im 169/254  lung]
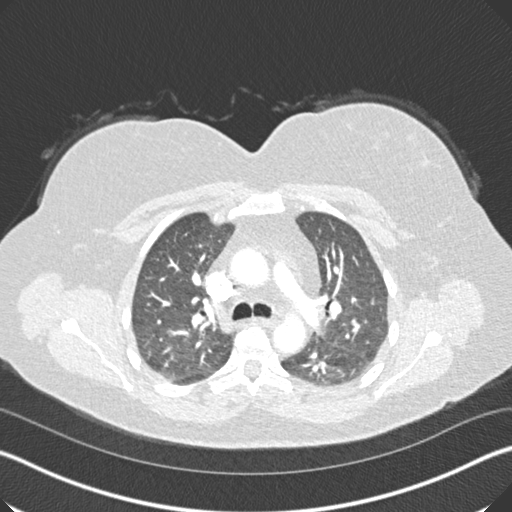
[im 183/254  soft-tissue]
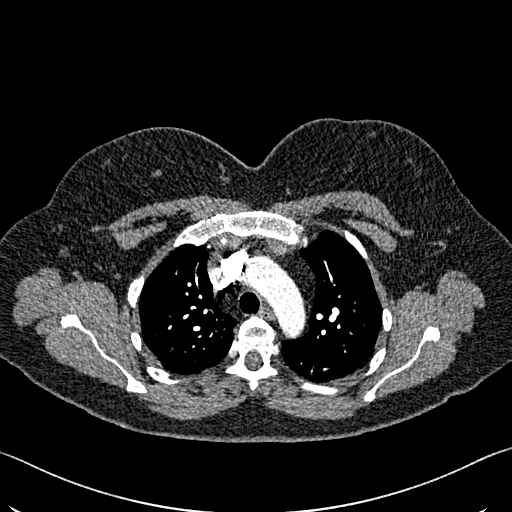
[im 197/254  lung]
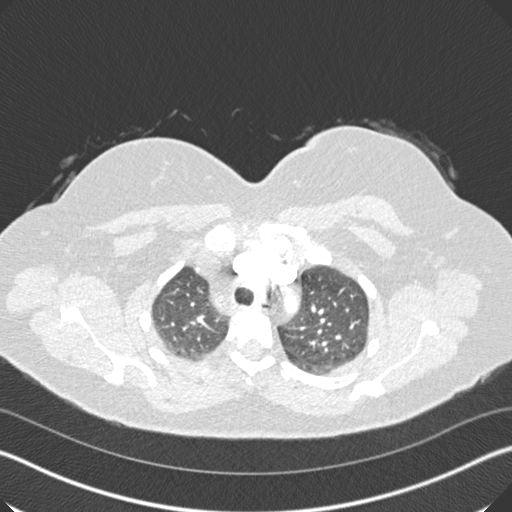
[im 211/254  soft-tissue]
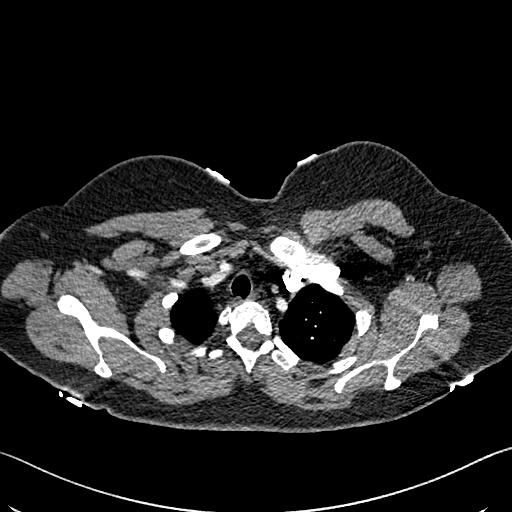
[im 225/254  lung]
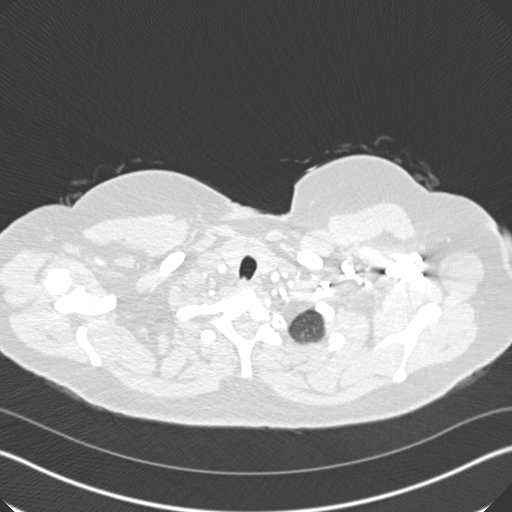
[im 239/254  soft-tissue]
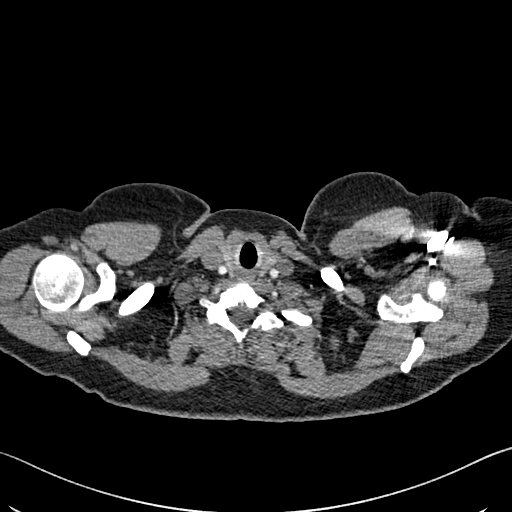

[Series 8: coronal mpr · coronal · 0.54mm/px · 3 of 151 slices shown]
[im 38/151  soft-tissue]
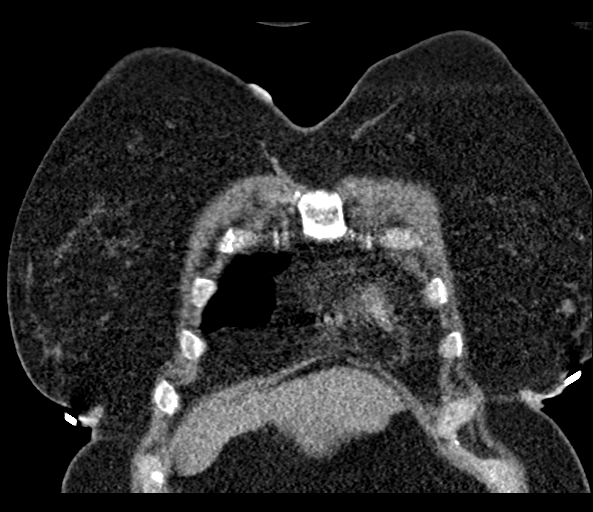
[im 76/151  soft-tissue]
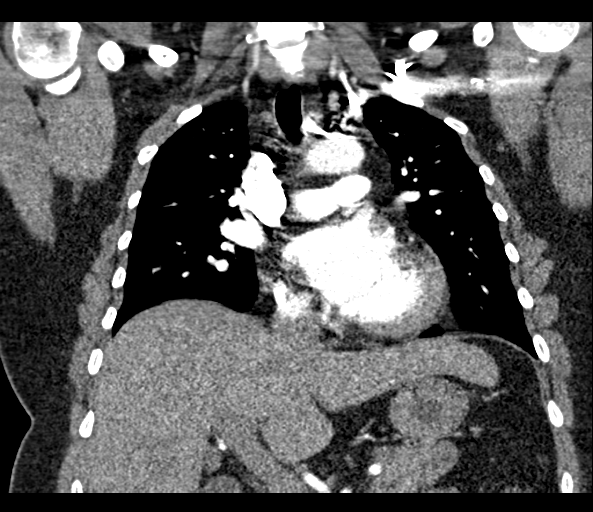
[im 113/151  soft-tissue]
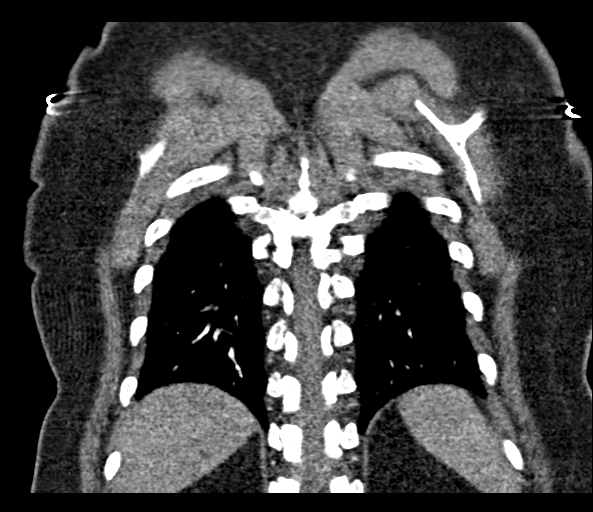

[19 of 46 positions shown; findings below may reference images not displayed]

FINDINGS: Cardiovascular: Satisfactory opacification of the pulmonary arteries
to the segmental level. No evidence of pulmonary embolism. Normal
heart size. No pericardial effusion.

Mediastinum/Nodes: No enlarged mediastinal, hilar, or axillary lymph
nodes. Thyroid gland, trachea, and esophagus demonstrate no
significant findings.

Lungs/Pleura: Lungs are clear. No pleural effusion or pneumothorax.

Upper Abdomen: No acute abnormality.

Musculoskeletal: No chest wall abnormality. No acute or significant
osseous findings.

Review of the MIP images confirms the above findings.
IMPRESSION: Negative examination for pulmonary embolism.
